# Patient Record
Sex: Male | Born: 1945
Health system: Southern US, Community
[De-identification: ages and names within clinical notes are randomized; demographics above are authoritative.]

## PROBLEM LIST (undated history)

## (undated) DIAGNOSIS — Z8719 Personal history of other diseases of the digestive system: Secondary | ICD-10-CM

## (undated) DIAGNOSIS — Z8551 Personal history of malignant neoplasm of bladder: Secondary | ICD-10-CM

## (undated) DIAGNOSIS — D494 Neoplasm of unspecified behavior of bladder: Secondary | ICD-10-CM

## (undated) DIAGNOSIS — I10 Essential (primary) hypertension: Secondary | ICD-10-CM

## (undated) DIAGNOSIS — Z973 Presence of spectacles and contact lenses: Secondary | ICD-10-CM

## (undated) DIAGNOSIS — K219 Gastro-esophageal reflux disease without esophagitis: Secondary | ICD-10-CM

## (undated) DIAGNOSIS — R351 Nocturia: Secondary | ICD-10-CM

## (undated) DIAGNOSIS — K589 Irritable bowel syndrome without diarrhea: Secondary | ICD-10-CM

## (undated) HISTORY — PX: TONSILLECTOMY: SUR1361

## (undated) HISTORY — PX: COLONOSCOPY: SHX174

---

## 2007-04-18 HISTORY — PX: KNEE ARTHROSCOPY: SUR90

## 2011-04-18 HISTORY — PX: BLEPHAROPLASTY: SUR158

## 2015-11-11 ENCOUNTER — Other Ambulatory Visit: Payer: Self-pay | Admitting: Internal Medicine

## 2015-11-11 ENCOUNTER — Ambulatory Visit
Admission: RE | Admit: 2015-11-11 | Discharge: 2015-11-11 | Disposition: A | Discharge status: Home / Self Care | Payer: Medicare Other | Source: Ambulatory Visit | Attending: Internal Medicine | Admitting: Internal Medicine

## 2015-11-11 DIAGNOSIS — K3589 Other acute appendicitis without perforation or gangrene: Secondary | ICD-10-CM

## 2015-11-11 MED ORDER — IOPAMIDOL (ISOVUE-300) INJECTION 61%
100.0000 mL | Freq: Once | INTRAVENOUS | Status: AC | PRN
  Administered 2015-11-11: 100 mL via INTRAVENOUS

## 2015-11-12 ENCOUNTER — Other Ambulatory Visit: Payer: Self-pay | Admitting: Internal Medicine

## 2016-03-08 ENCOUNTER — Other Ambulatory Visit (HOSPITAL_COMMUNITY): Payer: Self-pay | Admitting: Internal Medicine

## 2016-03-08 DIAGNOSIS — R1011 Right upper quadrant pain: Secondary | ICD-10-CM

## 2016-03-16 ENCOUNTER — Ambulatory Visit (HOSPITAL_COMMUNITY): Payer: Medicare Other

## 2016-03-17 ENCOUNTER — Ambulatory Visit: Payer: Medicare Other

## 2016-03-17 ENCOUNTER — Encounter (HOSPITAL_COMMUNITY)
Admission: RE | Admit: 2016-03-17 | Discharge: 2016-03-17 | Disposition: A | Discharge status: Home / Self Care | Payer: Medicare Other | Source: Ambulatory Visit | Attending: Internal Medicine | Admitting: Internal Medicine

## 2016-03-17 DIAGNOSIS — R1011 Right upper quadrant pain: Secondary | ICD-10-CM | POA: Insufficient documentation

## 2016-03-17 MED ORDER — TECHNETIUM TC 99M MEBROFENIN IV KIT
5.0000 | PACK | Freq: Once | INTRAVENOUS | Status: AC | PRN
  Administered 2016-03-17: 5 via INTRAVENOUS

## 2016-03-23 ENCOUNTER — Encounter: Payer: Self-pay | Admitting: Radiology

## 2017-02-14 IMAGING — CT CT ABD-PELV W/ CM
3 of 5 series · 12 of 36 positions shown, 18 images · IV contrast (READICAT/WATER & [ID] ISOVUE 300)
Comparison: None.

CLINICAL DATA: Right lower quadrant discomfort for 2 weeks

EXAM:
CT ABDOMEN AND PELVIS WITH CONTRAST
TECHNIQUE: Multidetector CT imaging of the abdomen and pelvis was performed
using the standard protocol following bolus administration of
intravenous contrast.
CONTRAST:  100mL [id number] IOPAMIDOL ([id number]) INJECTION 61%

[Series 3: abd/pelvis with · axial · 0.82mm/px · z∈[-366,-21]mm · 7 of 93 slices shown, 12 images]
[im 12/93  soft-tissue]
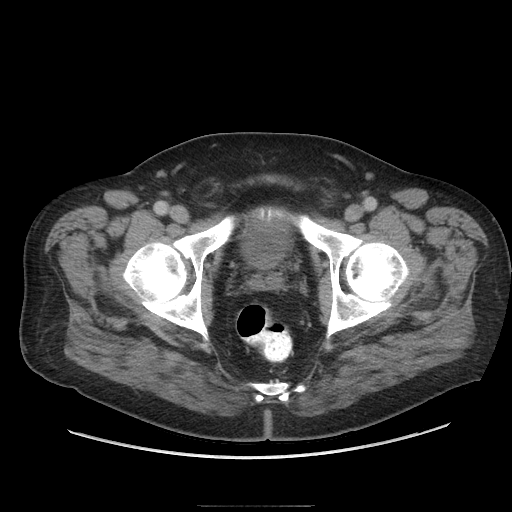
[im 12/93  bone]
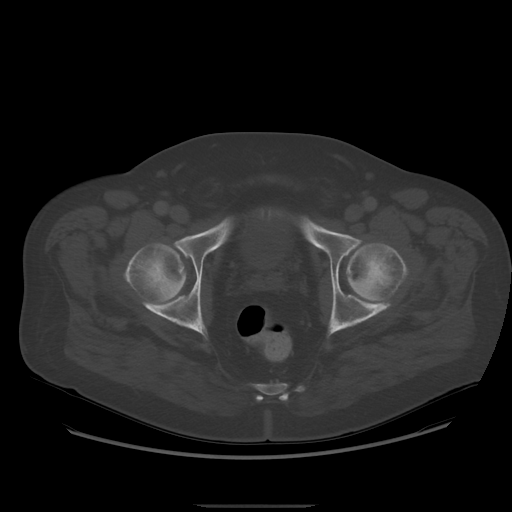
[im 24/93  soft-tissue]
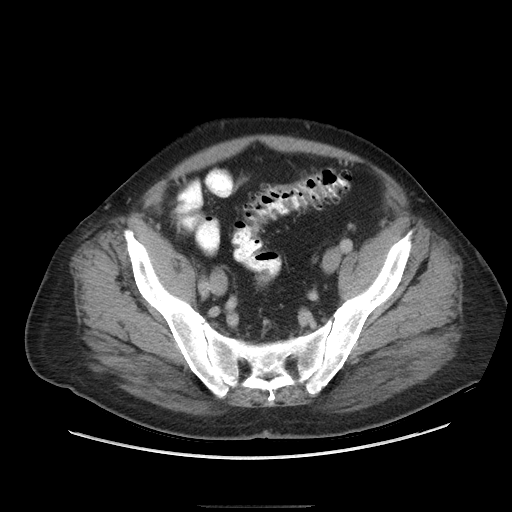
[im 35/93  soft-tissue]
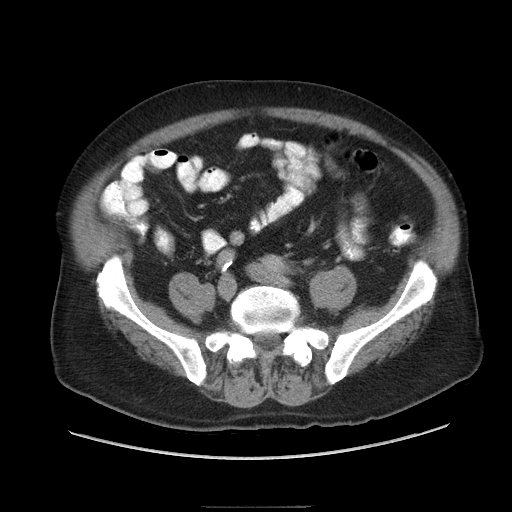
[im 47/93  soft-tissue]
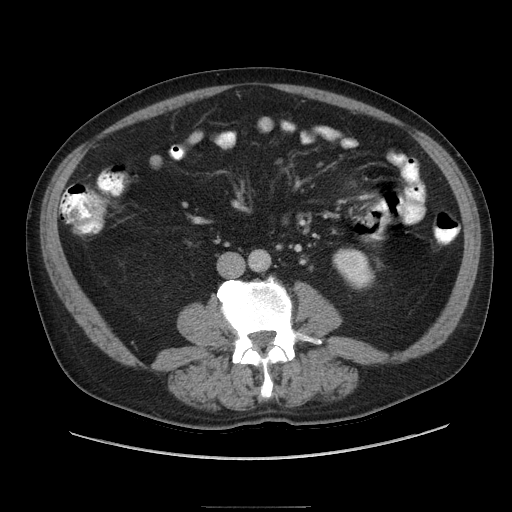
[im 47/93  lung]
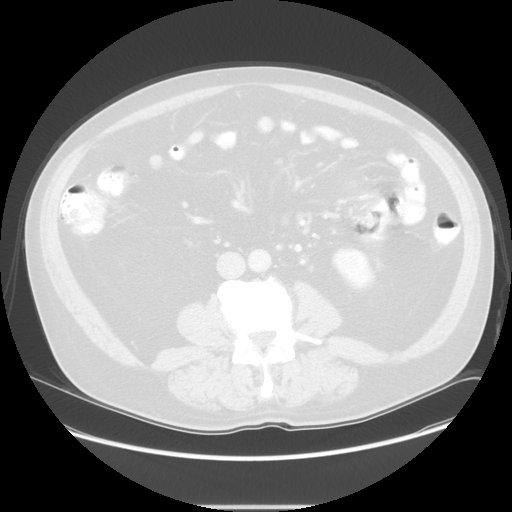
[im 58/93  soft-tissue]
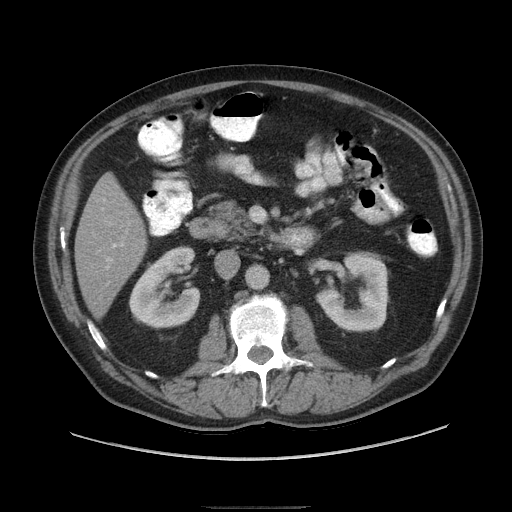
[im 58/93  lung]
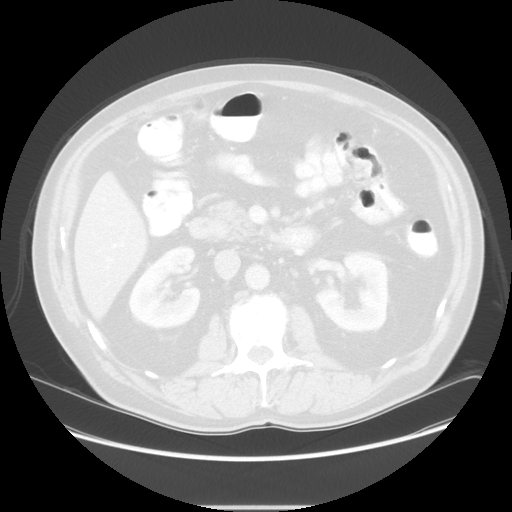
[im 70/93  soft-tissue]
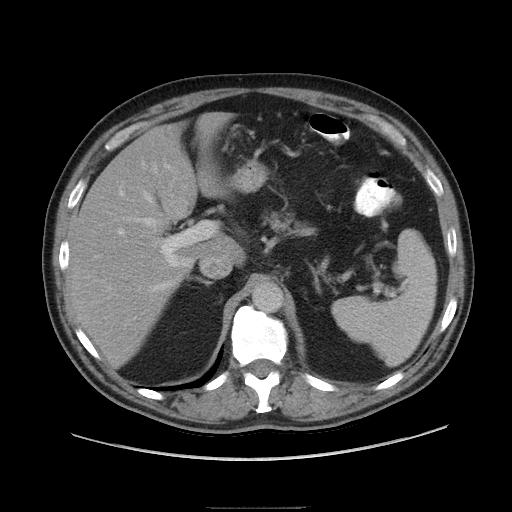
[im 70/93  lung]
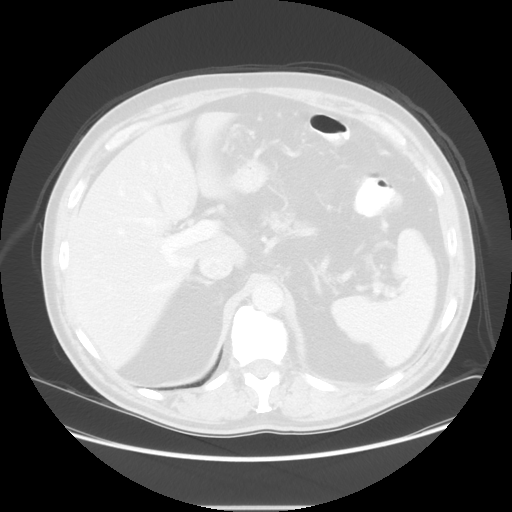
[im 81/93  soft-tissue]
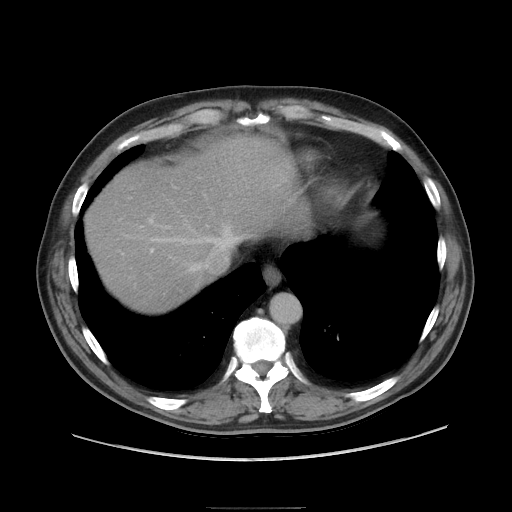
[im 81/93  lung]
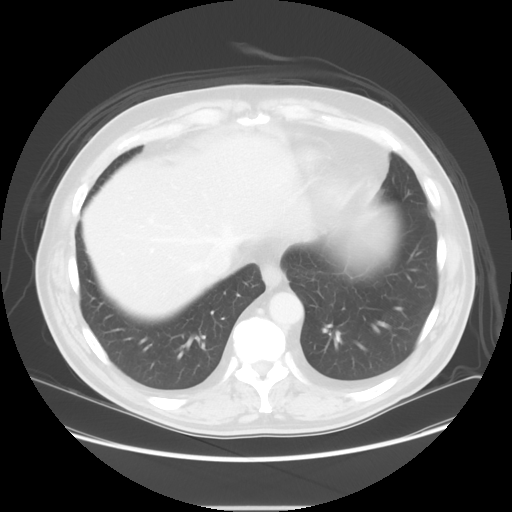

[Series 601: coronal body · coronal · 0.97mm/px · 1 of 127 slices shown, 2 images]
[im 43/127  soft-tissue]
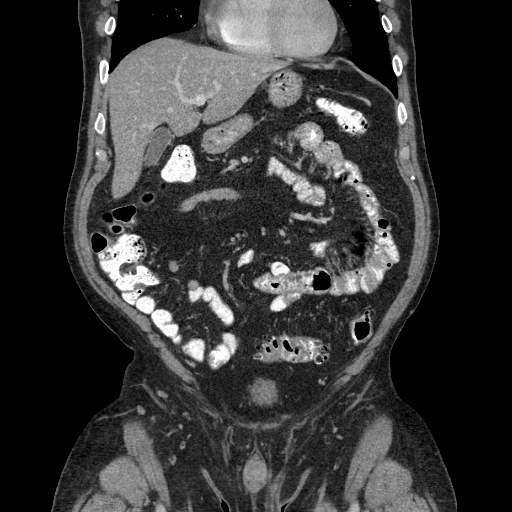
[im 43/127  bone]
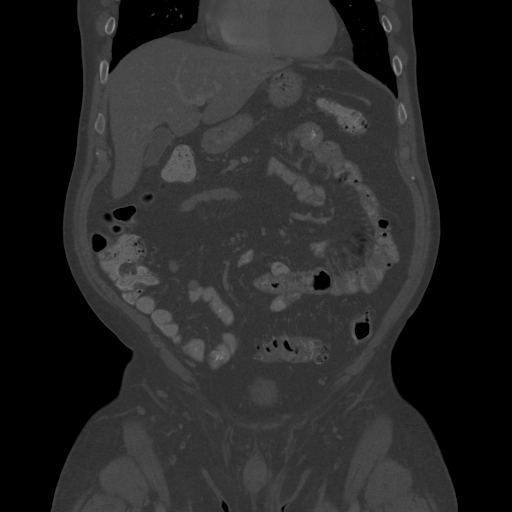

[Series 602: sagittal body · sagittal · 0.97mm/px · 4 of 164 slices shown]
[im 11/164  soft-tissue]
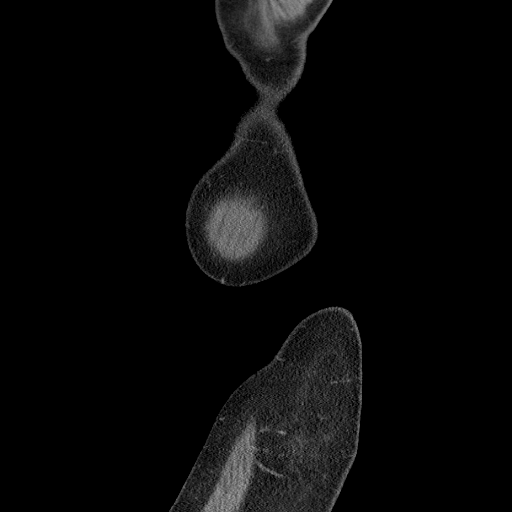
[im 31/164  soft-tissue]
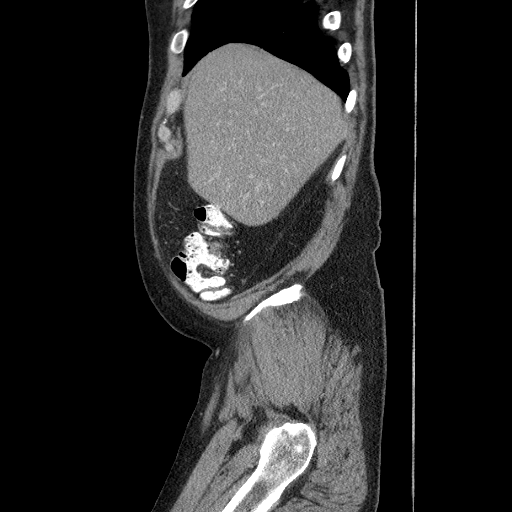
[im 51/164  soft-tissue]
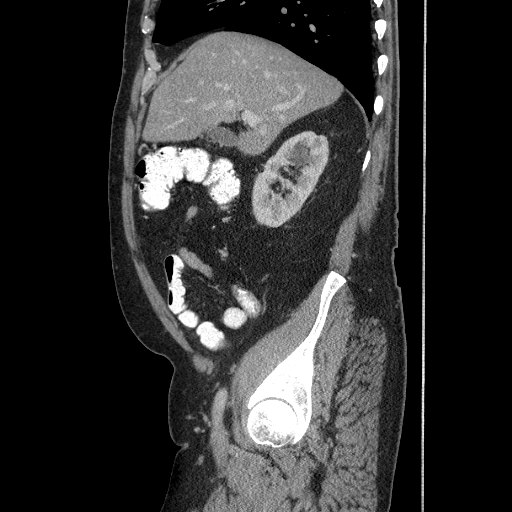
[im 72/164  soft-tissue]
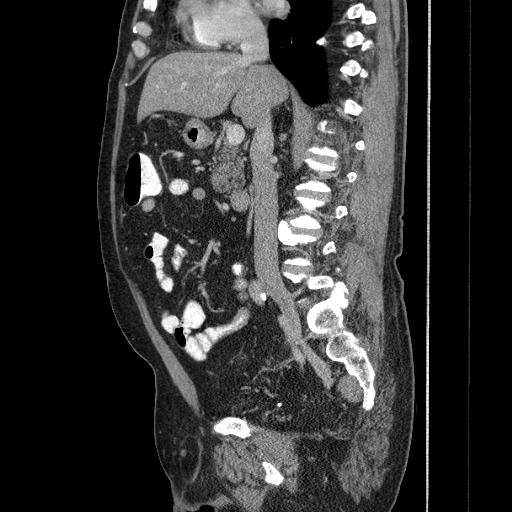

[12 of 36 positions shown; findings below may reference images not displayed]

Creatinine was obtained on site at [HOSPITAL] at [REDACTED].Results: Creatinine 0.9 mg/dL. GFR of 87.
FINDINGS: The lung bases are clear. The liver enhances with no suspicious
abnormality. The liver may be somewhat low in attenuation which
could indicate fatty infiltration. Recommend correlation with LFTs.
A single focus of calcification is noted near the dome of the right
lobe consistent with prior granulomatous disease. A small
low-attenuation structure near the posterior dome most likely
represents small cyst or hemangioma and is of doubtful significance.
No calcified gallstones are noted. The pancreas is normal in size
and the pancreatic duct is not dilated. The adrenal glands and
spleen are unremarkable. The stomach is decompressed. No renal
calculi are seen and there is no evidence of hydronephrosis. An
apparent parapelvic cyst is noted in the upper pole of the right
kidney. The proximal ureters are normal in caliber. The abdominal
aorta is normal in caliber.

The urinary bladder is not well distended and therefore is slightly
thick walled. There is mild focal thickening of the right
posterolateral urinary bladder wall of questionable significance,
but a bladder lesion would be difficult to exclude with this exam.
Clinical correlation is recommended. The prostate is normal in size
for age. Multiple rectosigmoid and distal descending colon
diverticula are noted. There is no evidence of diverticulitis. The
terminal ileum is well opacified with no abnormality noted. The
appendix is well seen and there is no evidence of acute
appendicitis. The lumbar vertebrae are in normal alignment with
normal intervertebral disc spaces.
IMPRESSION: 1. No explanation for the patient's right lower quadrant pain is
seen.
2. The appendix and terminal ileum are unremarkable.
3. There is an area of slight focal thickening of the right
posterolateral urinary bladder wall of questionable significance.
Correlate clinically.
4. Question mild fatty infiltration of the liver.

## 2017-09-27 ENCOUNTER — Other Ambulatory Visit: Payer: Self-pay | Admitting: Urology

## 2017-10-05 ENCOUNTER — Other Ambulatory Visit (HOSPITAL_COMMUNITY)
Admission: RE | Admit: 2017-10-05 | Discharge: 2017-10-05 | Disposition: A | Discharge status: Home / Self Care | Payer: Medicare Other | Source: Ambulatory Visit | Attending: Internal Medicine | Admitting: Internal Medicine

## 2017-10-05 DIAGNOSIS — R109 Unspecified abdominal pain: Secondary | ICD-10-CM | POA: Insufficient documentation

## 2017-10-05 DIAGNOSIS — R197 Diarrhea, unspecified: Secondary | ICD-10-CM | POA: Diagnosis present

## 2017-10-05 LAB — C DIFFICILE QUICK SCREEN W PCR REFLEX
C DIFFICILE (CDIFF) TOXIN: NEGATIVE
C Diff antigen: NEGATIVE
C Diff interpretation: NOT DETECTED

## 2017-10-06 LAB — GASTROINTESTINAL PANEL BY PCR, STOOL (REPLACES STOOL CULTURE)
ASTROVIRUS: NOT DETECTED
Adenovirus F40/41: NOT DETECTED
Campylobacter species: NOT DETECTED
Cryptosporidium: NOT DETECTED
Cyclospora cayetanensis: NOT DETECTED
ENTAMOEBA HISTOLYTICA: NOT DETECTED
ENTEROAGGREGATIVE E COLI (EAEC): NOT DETECTED
ENTEROPATHOGENIC E COLI (EPEC): NOT DETECTED
ENTEROTOXIGENIC E COLI (ETEC): NOT DETECTED
GIARDIA LAMBLIA: NOT DETECTED
NOROVIRUS GI/GII: NOT DETECTED
Plesimonas shigelloides: NOT DETECTED
Rotavirus A: NOT DETECTED
SAPOVIRUS (I, II, IV, AND V): NOT DETECTED
SHIGA LIKE TOXIN PRODUCING E COLI (STEC): NOT DETECTED
Salmonella species: NOT DETECTED
Shigella/Enteroinvasive E coli (EIEC): NOT DETECTED
VIBRIO CHOLERAE: NOT DETECTED
Vibrio species: NOT DETECTED
Yersinia enterocolitica: NOT DETECTED

## 2017-10-26 ENCOUNTER — Encounter (HOSPITAL_BASED_OUTPATIENT_CLINIC_OR_DEPARTMENT_OTHER): Payer: Self-pay | Admitting: *Deleted

## 2017-10-26 ENCOUNTER — Other Ambulatory Visit: Payer: Self-pay

## 2017-10-26 NOTE — Progress Notes (Signed)
Spoke w/ pt via phone for pre-op interview.  Npo after mn.  Arrive at [facility].  Needs istat and ekg.  Pt has medication list at home and pt was not at home (which is out of town) .  Pt to called on Monday 10-29-2017 to give medication list and then be given instructions if any to take am dos.

## 2017-10-29 ENCOUNTER — Encounter (HOSPITAL_BASED_OUTPATIENT_CLINIC_OR_DEPARTMENT_OTHER): Payer: Self-pay

## 2017-10-29 NOTE — Progress Notes (Signed)
Brendan Evans will take his Omeprazole and Fluoxetine the morning of surgery with a sip of water.

## 2017-10-31 ENCOUNTER — Encounter (HOSPITAL_BASED_OUTPATIENT_CLINIC_OR_DEPARTMENT_OTHER): Payer: Self-pay

## 2017-10-31 ENCOUNTER — Ambulatory Visit (HOSPITAL_BASED_OUTPATIENT_CLINIC_OR_DEPARTMENT_OTHER): Payer: Medicare Other | Admitting: Anesthesiology

## 2017-10-31 ENCOUNTER — Encounter (HOSPITAL_BASED_OUTPATIENT_CLINIC_OR_DEPARTMENT_OTHER)
Admission: RE | Disposition: A | Discharge status: Home / Self Care | Payer: Self-pay | Source: Ambulatory Visit | Attending: Urology

## 2017-10-31 ENCOUNTER — Ambulatory Visit (HOSPITAL_BASED_OUTPATIENT_CLINIC_OR_DEPARTMENT_OTHER)
Admission: RE | Admit: 2017-10-31 | Discharge: 2017-10-31 | Disposition: A | Discharge status: Home / Self Care | Payer: Medicare Other | Source: Ambulatory Visit | Attending: Urology | Admitting: Urology

## 2017-10-31 DIAGNOSIS — C674 Malignant neoplasm of posterior wall of bladder: Secondary | ICD-10-CM | POA: Diagnosis not present

## 2017-10-31 DIAGNOSIS — N281 Cyst of kidney, acquired: Secondary | ICD-10-CM | POA: Diagnosis not present

## 2017-10-31 DIAGNOSIS — D494 Neoplasm of unspecified behavior of bladder: Secondary | ICD-10-CM | POA: Diagnosis present

## 2017-10-31 DIAGNOSIS — C671 Malignant neoplasm of dome of bladder: Secondary | ICD-10-CM

## 2017-10-31 DIAGNOSIS — Z79899 Other long term (current) drug therapy: Secondary | ICD-10-CM | POA: Diagnosis not present

## 2017-10-31 DIAGNOSIS — I10 Essential (primary) hypertension: Secondary | ICD-10-CM | POA: Insufficient documentation

## 2017-10-31 DIAGNOSIS — Z87440 Personal history of urinary (tract) infections: Secondary | ICD-10-CM | POA: Insufficient documentation

## 2017-10-31 DIAGNOSIS — Z87891 Personal history of nicotine dependence: Secondary | ICD-10-CM | POA: Insufficient documentation

## 2017-10-31 DIAGNOSIS — Z882 Allergy status to sulfonamides status: Secondary | ICD-10-CM | POA: Insufficient documentation

## 2017-10-31 HISTORY — DX: Essential (primary) hypertension: I10

## 2017-10-31 HISTORY — DX: Irritable bowel syndrome without diarrhea: K58.9

## 2017-10-31 HISTORY — PX: TRANSURETHRAL RESECTION OF BLADDER TUMOR: SHX2575

## 2017-10-31 HISTORY — DX: Neoplasm of unspecified behavior of bladder: D49.4

## 2017-10-31 HISTORY — DX: Irritable bowel syndrome, unspecified: K58.9

## 2017-10-31 HISTORY — DX: Presence of spectacles and contact lenses: Z97.3

## 2017-10-31 HISTORY — PX: CYSTOSCOPY W/ RETROGRADES: SHX1426

## 2017-10-31 HISTORY — DX: Nocturia: R35.1

## 2017-10-31 LAB — POCT I-STAT 4, (NA,K, GLUC, HGB,HCT)
Glucose, Bld: 40 mg/dL — CL (ref 70–99)
HCT: 43 % (ref 39.0–52.0)
Hemoglobin: 14.6 g/dL (ref 13.0–17.0)
POTASSIUM: 4.3 mmol/L (ref 3.5–5.1)
Sodium: 140 mmol/L (ref 135–145)

## 2017-10-31 LAB — GLUCOSE, CAPILLARY: GLUCOSE-CAPILLARY: 116 mg/dL — AB (ref 70–99)

## 2017-10-31 SURGERY — TURBT (TRANSURETHRAL RESECTION OF BLADDER TUMOR)
Anesthesia: General

## 2017-10-31 MED ORDER — ONDANSETRON HCL 4 MG/2ML IJ SOLN
INTRAMUSCULAR | Status: DC | PRN
  Administered 2017-10-31: 4 mg via INTRAVENOUS

## 2017-10-31 MED ORDER — ONDANSETRON HCL 4 MG/2ML IJ SOLN
INTRAMUSCULAR | Status: AC
  Filled 2017-10-31: qty 2

## 2017-10-31 MED ORDER — BELLADONNA ALKALOIDS-OPIUM 16.2-60 MG RE SUPP
RECTAL | Status: DC | PRN
  Administered 2017-10-31: 1 via RECTAL

## 2017-10-31 MED ORDER — LACTATED RINGERS IV SOLN
INTRAVENOUS | Status: DC
  Administered 2017-10-31: 07:00:00 via INTRAVENOUS
  Filled 2017-10-31: qty 1000

## 2017-10-31 MED ORDER — IOHEXOL 300 MG/ML  SOLN
INTRAMUSCULAR | Status: DC | PRN
  Administered 2017-10-31: 13 mL via URETHRAL

## 2017-10-31 MED ORDER — CEFAZOLIN SODIUM-DEXTROSE 2-4 GM/100ML-% IV SOLN
2.0000 g | INTRAVENOUS | Status: AC
  Administered 2017-10-31: 2 g via INTRAVENOUS
  Filled 2017-10-31: qty 100

## 2017-10-31 MED ORDER — TRAMADOL HCL 50 MG PO TABS
50.0000 mg | ORAL_TABLET | Freq: Four times a day (QID) | ORAL | Status: DC | PRN
  Administered 2017-10-31: 50 mg via ORAL
  Filled 2017-10-31: qty 1

## 2017-10-31 MED ORDER — TRAMADOL HCL 50 MG PO TABS: 50.0000 mg | ORAL_TABLET | Freq: Four times a day (QID) | ORAL | 0 refills | Status: DC | PRN

## 2017-10-31 MED ORDER — LIDOCAINE 2% (20 MG/ML) 5 ML SYRINGE
INTRAMUSCULAR | Status: DC | PRN
  Administered 2017-10-31: 60 mg via INTRAVENOUS
  Administered 2017-10-31: 40 mg via INTRAVENOUS

## 2017-10-31 MED ORDER — TRAMADOL HCL 50 MG PO TABS
ORAL_TABLET | ORAL | Status: AC
  Filled 2017-10-31: qty 1

## 2017-10-31 MED ORDER — SODIUM CHLORIDE 0.9 % IR SOLN
Status: DC | PRN
  Administered 2017-10-31: 12000 mL via INTRAVESICAL
  Administered 2017-10-31: 3000 mL via INTRAVESICAL

## 2017-10-31 MED ORDER — FENTANYL CITRATE (PF) 100 MCG/2ML IJ SOLN
INTRAMUSCULAR | Status: AC
Start: 2017-10-31 — End: ?
  Filled 2017-10-31: qty 2

## 2017-10-31 MED ORDER — ARTIFICIAL TEARS OPHTHALMIC OINT
TOPICAL_OINTMENT | OPHTHALMIC | Status: AC
  Filled 2017-10-31: qty 3.5

## 2017-10-31 MED ORDER — DEXAMETHASONE SODIUM PHOSPHATE 4 MG/ML IJ SOLN
INTRAMUSCULAR | Status: DC | PRN
  Administered 2017-10-31: 10 mg via INTRAVENOUS

## 2017-10-31 MED ORDER — PHENAZOPYRIDINE HCL 100 MG PO TABS
ORAL_TABLET | ORAL | Status: AC
  Filled 2017-10-31: qty 2

## 2017-10-31 MED ORDER — LIDOCAINE 2% (20 MG/ML) 5 ML SYRINGE
INTRAMUSCULAR | Status: AC
  Filled 2017-10-31: qty 5

## 2017-10-31 MED ORDER — CEFAZOLIN SODIUM-DEXTROSE 2-4 GM/100ML-% IV SOLN
INTRAVENOUS | Status: AC
  Filled 2017-10-31: qty 100

## 2017-10-31 MED ORDER — PROPOFOL 10 MG/ML IV BOLUS
INTRAVENOUS | Status: AC
  Filled 2017-10-31: qty 40

## 2017-10-31 MED ORDER — PROPOFOL 10 MG/ML IV BOLUS
INTRAVENOUS | Status: DC | PRN
  Administered 2017-10-31 (×2): 100 mg via INTRAVENOUS

## 2017-10-31 MED ORDER — DEXAMETHASONE SODIUM PHOSPHATE 10 MG/ML IJ SOLN
INTRAMUSCULAR | Status: AC
  Filled 2017-10-31: qty 1

## 2017-10-31 MED ORDER — PHENAZOPYRIDINE HCL 200 MG PO TABS
200.0000 mg | ORAL_TABLET | Freq: Three times a day (TID) | ORAL | Status: DC
  Administered 2017-10-31: 200 mg via ORAL
  Filled 2017-10-31: qty 1

## 2017-10-31 MED ORDER — BELLADONNA ALKALOIDS-OPIUM 16.2-60 MG RE SUPP
RECTAL | Status: AC
  Filled 2017-10-31: qty 1

## 2017-10-31 MED ORDER — FENTANYL CITRATE (PF) 100 MCG/2ML IJ SOLN
INTRAMUSCULAR | Status: DC | PRN
  Administered 2017-10-31: 25 ug via INTRAVENOUS
  Administered 2017-10-31 (×2): 50 ug via INTRAVENOUS
  Administered 2017-10-31 (×3): 25 ug via INTRAVENOUS

## 2017-10-31 MED ORDER — PHENAZOPYRIDINE HCL 200 MG PO TABS: 200.0000 mg | ORAL_TABLET | Freq: Three times a day (TID) | ORAL | 0 refills | Status: DC | PRN

## 2017-10-31 MED ORDER — LIDOCAINE HCL URETHRAL/MUCOSAL 2 % EX GEL
CUTANEOUS | Status: DC | PRN
  Administered 2017-10-31: 1 via URETHRAL

## 2017-10-31 MED FILL — traMADol HCL 50 MG TABS: 50 | 2 days supply | Qty: 15 | Fill #0

## 2017-10-31 SURGICAL SUPPLY — 39 items
BAG DRAIN URO-CYSTO SKYTR STRL (DRAIN) ×4 IMPLANT
BAG URINE DRAINAGE (UROLOGICAL SUPPLIES) ×4 IMPLANT
BASKET LASER NITINOL 1.9FR (BASKET) IMPLANT
BASKET STNLS GEMINI 4WIRE 3FR (BASKET) IMPLANT
BASKET ZERO TIP NITINOL 2.4FR (BASKET) IMPLANT
CATH FOLEY 3WAY 30CC 22FR (CATHETERS) ×4 IMPLANT
CATH URET 5FR 28IN OPEN ENDED (CATHETERS) ×4 IMPLANT
CATH URET DUAL LUMEN 6-10FR 50 (CATHETERS) IMPLANT
CLOTH BEACON ORANGE TIMEOUT ST (SAFETY) ×4 IMPLANT
ELECT REM PT RETURN 9FT ADLT (ELECTROSURGICAL)
ELECTRODE REM PT RTRN 9FT ADLT (ELECTROSURGICAL) IMPLANT
EVACUATOR MICROVAS BLADDER (UROLOGICAL SUPPLIES) IMPLANT
EXTRACTOR STONE 1.7FRX115CM (UROLOGICAL SUPPLIES) IMPLANT
FIBER LASER TRAC TIP (UROLOGICAL SUPPLIES) IMPLANT
GLOVE BIO SURGEON STRL SZ7.5 (GLOVE) ×4 IMPLANT
GOWN STRL REUS W/ TWL XL LVL3 (GOWN DISPOSABLE) ×4 IMPLANT
GOWN STRL REUS W/TWL XL LVL3 (GOWN DISPOSABLE) ×8 IMPLANT
GUIDEWIRE 0.038 PTFE COATED (WIRE) IMPLANT
GUIDEWIRE ANG ZIPWIRE 038X150 (WIRE) IMPLANT
GUIDEWIRE STR DUAL SENSOR (WIRE) ×4 IMPLANT
HOLDER FOLEY CATH W/STRAP (MISCELLANEOUS) IMPLANT
INFUSOR MANOMETER BAG 3000ML (MISCELLANEOUS) ×4 IMPLANT
IV NS IRRIG 3000ML ARTHROMATIC (IV SOLUTION) ×16 IMPLANT
KIT BALLIN UROMAX 15FX10 (LABEL) IMPLANT
KIT BALLN UROMAX 15FX4 (MISCELLANEOUS) IMPLANT
KIT BALLN UROMAX 26 75X4 (MISCELLANEOUS)
KIT TURNOVER CYSTO (KITS) ×4 IMPLANT
LOOP CUT BIPOLAR 24F LRG (ELECTROSURGICAL) ×4 IMPLANT
MANIFOLD NEPTUNE II (INSTRUMENTS) ×4 IMPLANT
NS IRRIG 500ML POUR BTL (IV SOLUTION) ×4 IMPLANT
PACK CYSTO (CUSTOM PROCEDURE TRAY) ×4 IMPLANT
SET HIGH PRES BAL DIL (LABEL)
SHEATH ACCESS URETERAL 38CM (SHEATH) IMPLANT
SYR 30ML LL (SYRINGE) IMPLANT
SYRINGE IRR TOOMEY STRL 70CC (SYRINGE) ×4 IMPLANT
TUBE CONNECTING 12'X1/4 (SUCTIONS)
TUBE CONNECTING 12X1/4 (SUCTIONS) IMPLANT
TUBING UROLOGY SET (TUBING) IMPLANT
WATER STERILE IRR 500ML POUR (IV SOLUTION) ×4 IMPLANT

## 2017-10-31 NOTE — Transfer of Care (Signed)
Last Vitals:  Vitals Value Taken Time  BP 158/89 10/31/2017  9:34 AM  Temp    Pulse 84 10/31/2017  9:36 AM  Resp 13 10/31/2017  9:36 AM  SpO2 99 % 10/31/2017  9:36 AM  Vitals shown include unvalidated device data.  Last Pain:  Vitals:   10/31/17 0655  TempSrc:   PainSc: 0-No pain      Patients Stated Pain Goal: 5 (10/31/17 7471)  Immediate Anesthesia Transfer of Care Note  Patient: Brendan Evans  Procedure(s) Performed: Procedure(s) (LRB): TRANSURETHRAL RESECTION OF BLADDER TUMOR (TURBT) (N/A) CYSTOSCOPY WITH BILATERAL RETROGRADE PYELOGRAM (Bilateral)  Patient Location: PACU  Anesthesia Type: General  Level of Consciousness: awake, alert  and oriented  Airway & Oxygen Therapy: Patient Spontanous Breathing and Patient connected to nasal cannula oxygen  Post-op Assessment: Report given to PACU RN and Post -op Vital signs reviewed and stable  Post vital signs: Reviewed and stable  Complications: No apparent anesthesia complications

## 2017-10-31 NOTE — Op Note (Signed)
Preoperative diagnosis:  1. Bladder tumor right posterior lateral wall  Postoperative diagnosis:  1. Same  Procedure: 1. Transurethral resection of bladder tumor 3 cm 2. Bilateral retrograde pyelograms with interpretation  Surgeon: Ardis Hughs, MD  Anesthesia: General  Complications: None  Intraoperative findings:  #1: The right retrograde pyelogram demonstrated normal caliber ureter with no filling defects.  The renal pelvis had sharp calyces, there was some decreased filling in the upper pole of the right kidney, correlating with the patient's peripelvic cyst that was seen on CT scan.  No discrete filling defects. #2: The left retrograde pyelogram demonstrated normal caliber ureters with no filling defects along the ureter.  The renal pelvis had sharp calyces with no significant hydronephrosis or filling defects. #3: The patient had a low-grade appearing tumor that spread approximately 3 cm in size.  Was located on the right posterior lateral wall just posterior to the ureteral orifice.  EBL: Minimal  Specimens: Bladder tumor, right posterior lateral wall  Indication: Brendan Evans is a 72 y.o. patient with gross hematuria with an evaluation that demonstrated papillary lesion in the right posterior lateral wall.  After reviewing the management options for treatment, he elected to proceed with the above surgical procedure(s). We have discussed the potential benefits and risks of the procedure, side effects of the proposed treatment, the likelihood of the patient achieving the goals of the procedure, and any potential problems that might occur during the procedure or recuperation. Informed consent has been obtained.  Description of procedure:  The patient was taken to the operating room and general anesthesia was induced.  The patient was placed in the dorsal lithotomy position, prepped and draped in the usual sterile fashion, and preoperative antibiotics were administered. A  preoperative time-out was performed.   A 21 French 30 degrees cystoscope was gently passed through the patient's urethra and into the bladder under visual guidance.  I exchanged the 30 degree lens for the 70 degree lens and performed cystoscopy demonstrating the above findings.  The patient did have a high median bar and intra-vesicoprostatic extrusion, but no other significant abnormalities other than the tumor noted on the right posterior lateral wall.  I then re-exchanged the 30 degree lens and performed the bilateral retrograde pyelograms using Omnipaque contrast as described above.  I then removed the 30 degree 21 French cystoscope and replaced it with the 26 French resectoscope sheath using the visual obturator and the 30 degree lens.  Once in the bladder I exchange the obturator for the loop resection element.  I then proceeded with a transurethral resection of the bladder tumor.  Muscle was noted to be within the specimen.  The resection defect did not appear to extend through the bladder wall.  The base of the tumor as well as the edges were cauterized and hemostasis was noted to be excellent.  The bladder tumor specimen was then irrigated from the patient's bladder.  There is no bleeding at the end of the case.  The patient had a B&O suppository placed in his rectum and urethral lidocaine jelly at the end of the case.  He was subsequently extubated return the PACU in stable condition.  Ardis Hughs, M.D.

## 2017-10-31 NOTE — H&P (Signed)
gross hematuria evaluation  HPI: Brendan Evans is a 72 year-old male patient who was referred by Dr. Lance Muss, MD who is here for further evaluation of gross hematuria.Marland Kitchen  He first noticed the blood approximately 06/15/2017. He has noticed blood only once.   The patient has not had any recent abdominal imaging. He has been told that he has blood in his urine prior to this episode. The patient has had a previous hematuria evaluation.   The patient has had progression of his voiding symptoms over the last 6 months including frequency. The patient complains of recent onset back pain.   The patient does have a history of recurrent UTIs. They do not have a history of kidney stones. He has not been exposed to occupational hazards that may increase their risks for developing cancer. The patient has no family history of GU malignancy. The patient is a former smoker. He quit smoking 40 years ago.   08/15/17: BUN/Cr - 16/0.87, PSA 0.5  CT scan 10/2015 - thickening on right bladder wall     ALLERGIES:     MEDICATIONS: Fish Oil CAPS Oral  FLUoxetine HCl - 40 MG Oral Capsule Oral  Librax 5-2.5 MG Oral Capsule Oral  Multi-Vitamin TABS Oral  Nasacort AQ AERS Nasal  Saw Palmetto TABS Oral     GU PSH: Vasectomy - 2011      PSH Notes: Surgery Of Male Genitalia Vasectomy, Knee Surgery   NON-GU PSH: No Non-GU PSH    GU PMH: BPH w/LUTS, Benign localized prostatic hyperplasia with lower urinary tract symptoms (LUTS) - 2014 Hematuria, Unspec, Hematuria - 2014 Renal cyst, Renal cyst, acquired - 2014      PMH Notes:  1898-04-17 00:00:00 - Note: Normal Routine History And Physical Adult   NON-GU PMH: No Non-GU PMH    FAMILY HISTORY: Family Health Status Number - Runs In Family Father Deceased At Age44 ___ - Runs In Family Mother Deceased At Age 110 from diabetic complicati - Runs In Family   SOCIAL HISTORY: No Social History     Notes: Marital History - Single, Caffeine Use, Tobacco Use,  Alcohol Use   REVIEW OF SYSTEMS:    GU Review Male:   Patient denies frequent urination, hard to postpone urination, burning/ pain with urination, get up at night to urinate, leakage of urine, stream starts and stops, trouble starting your stream, have to strain to urinate , erection problems, and penile pain.  Gastrointestinal (Upper):   Patient denies nausea, vomiting, and indigestion/ heartburn.  Gastrointestinal (Lower):   Patient denies diarrhea and constipation.  Constitutional:   Patient denies fever, night sweats, weight loss, and fatigue.  Skin:   Patient denies skin rash/ lesion and itching.  Eyes:   Patient denies blurred vision and double vision.  Ears/ Nose/ Throat:   Patient denies sore throat and sinus problems.  Hematologic/Lymphatic:   Patient denies swollen glands and easy bruising.  Cardiovascular:   Patient denies leg swelling and chest pains.  Respiratory:   Patient denies cough and shortness of breath.  Endocrine:   Patient denies excessive thirst.  Musculoskeletal:   Patient denies back pain and joint pain.  Neurological:   Patient denies headaches and dizziness.  Psychologic:   Patient denies depression and anxiety.   VITAL SIGNS:      09/25/2017 10:00 AM  Weight 200 lb / 90.72 kg  BP 148/88 mmHg  Pulse 86 /min  Temperature 99.0 F / 37.2 C   GU PHYSICAL EXAMINATION:  Scrotum: No lesions. No edema. No cysts. No warts.  Testes: No tenderness, no swelling, no enlargement left testes. No tenderness, no swelling, no enlargement right testes. Normal location left testes. Normal location right testes. No mass, no cyst, no varicocele, no hydrocele left testes. No mass, no cyst, no varicocele, no hydrocele right testes.  Urethral Meatus: Normal size. No lesion, no wart, no discharge, no polyp. Normal location.  Penis: Circumcised, no warts, no cracks. No dorsal Peyronie's plaques, no left corporal Peyronie's plaques, no right corporal Peyronie's plaques, no scarring, no  warts. No balanitis, no meatal stenosis.   MULTI-SYSTEM PHYSICAL EXAMINATION:    Constitutional: Well-nourished. No physical deformities. Normally developed. Good grooming.  Neck: Neck symmetrical, not swollen. Normal tracheal position.  Respiratory: No labored breathing, no use of accessory muscles. Normal breath sounds.  Cardiovascular: Regular rate and rhythm. No murmur, no gallop. Normal temperature, normal extremity pulses, no swelling, no varicosities.  Lymphatic: No enlargement of neck, axillae, groin.  Skin: No paleness, no jaundice, no cyanosis. No lesion, no ulcer, no rash.  Neurologic / Psychiatric: Oriented to time, oriented to place, oriented to person. No depression, no anxiety, no agitation.  Gastrointestinal: No mass, no tenderness, no rigidity, non obese abdomen.  Eyes: Normal conjunctivae. Normal eyelids.  Ears, Nose, Mouth, and Throat: Left ear no scars, no lesions, no masses. Right ear no scars, no lesions, no masses. Nose no scars, no lesions, no masses. Normal hearing. Normal lips.  Musculoskeletal: Normal gait and station of head and neck.     PAST DATA REVIEWED:  Source Of History:  Patient  Records Review:   Previous Doctor Records, Previous Patient Records, POC Tool  X-Ray Review: C.T. Abdomen/Pelvis: Reviewed Films. Discussed With Patient. 2017 shows a thickened area on the right bladder wall.    PROCEDURES:         Flexible Cystoscopy - 52000  Risks, benefits, and some of the potential complications of the procedure were discussed at length with the patient including infection, bleeding, voiding discomfort, urinary retention, fever, chills, sepsis, and others. All questions were answered. Informed consent was obtained. Sterile technique and intraurethral analgesia were used.  Meatus:  Normal size. Normal location. Normal condition.  Urethra:  No strictures.  External Sphincter:  Normal.  Verumontanum:  Normal.  Prostate:  Non-obstructing. No hyperplasia.   Bladder Neck:  Non-obstructing.  Ureteral Orifices:  Normal location. Normal size. Normal shape. Effluxed clear urine.  Bladder:  2-3 cm papillary tumor on the right lateral posterior wall. There is some small satellite lesions around it.      The lower urinary tract was carefully examined. The procedure was well-tolerated and without complications. Antibiotic instructions were given. Instructions were given to call the office immediately for bloody urine, difficulty urinating, urinary retention, painful or frequent urination, fever, chills, nausea, vomiting or other illness. The patient stated that he understood these instructions and would comply with them.         Urinalysis Dipstick Dipstick Cont'd  Color: Yellow Bilirubin: Neg  Appearance: Clear Ketones: Neg  Specific Gravity: 1.025 Blood: Neg  pH: 5.5 Protein: Trace  Glucose: Neg Urobilinogen: 0.2    Nitrites: Neg    Leukocyte Esterase: Neg    ASSESSMENT:      ICD-10 Details  1 GU:   Gross hematuria - R31.0    PLAN:           Schedule Return Visit/Planned Activity: ASAP - Schedule Surgery          Document  Letter(s):  Created for Patient: Clinical Summary         Notes:   The patient has a small papillary tumor in his bladder consistent with bladder cancer. I went over the diagnosis with the patient and the treatment of it. We discussed a transurethral resection of his bladder tumor. I went over the procedure as well as the risk and benefits. After going to the procedure the patient had a firm understanding of it and also questions were answered. Our plan is to schedule the patient for removal of the tumor as soon as possible.

## 2017-10-31 NOTE — Anesthesia Preprocedure Evaluation (Signed)
Anesthesia Evaluation  Patient identified by MRN, date of birth, ID band Patient awake    Reviewed: Allergy & Precautions, NPO status , Patient's Chart, lab work & pertinent test results  Airway Mallampati: I  TM Distance: >3 FB Neck ROM: Full    Dental   Pulmonary former smoker,    Pulmonary exam normal        Cardiovascular hypertension, Pt. on medications Normal cardiovascular exam     Neuro/Psych    GI/Hepatic   Endo/Other    Renal/GU      Musculoskeletal   Abdominal   Peds  Hematology   Anesthesia Other Findings   Reproductive/Obstetrics                             Anesthesia Physical Anesthesia Plan  ASA: II  Anesthesia Plan: General   Post-op Pain Management:    Induction: Intravenous  PONV Risk Score and Plan: 2 and Ondansetron and Treatment may vary due to age or medical condition  Airway Management Planned: LMA  Additional Equipment:   Intra-op Plan:   Post-operative Plan: Extubation in OR  Informed Consent: I have reviewed the patients History and Physical, chart, labs and discussed the procedure including the risks, benefits and alternatives for the proposed anesthesia with the patient or authorized representative who has indicated his/her understanding and acceptance.     Plan Discussed with: CRNA and Surgeon  Anesthesia Plan Comments:         Anesthesia Quick Evaluation

## 2017-10-31 NOTE — Anesthesia Procedure Notes (Signed)
Procedure Name: LMA Insertion Date/Time: 10/31/2017 8:42 AM Performed by: Lillia Abed, MD Pre-anesthesia Checklist: Patient identified, Emergency Drugs available, Suction available and Patient being monitored Patient Re-evaluated:Patient Re-evaluated prior to induction Oxygen Delivery Method: Circle system utilized Preoxygenation: Pre-oxygenation with 100% oxygen Induction Type: IV induction Ventilation: Mask ventilation without difficulty LMA: LMA inserted LMA Size: 4.0 Number of attempts: 1 Airway Equipment and Method: Bite block Placement Confirmation: positive ETCO2 Tube secured with: Tape Dental Injury: Teeth and Oropharynx as per pre-operative assessment

## 2017-10-31 NOTE — Anesthesia Postprocedure Evaluation (Signed)
Anesthesia Post Note  Patient: Brendan Evans  Procedure(s) Performed: TRANSURETHRAL RESECTION OF BLADDER TUMOR (TURBT) (N/A ) CYSTOSCOPY WITH BILATERAL RETROGRADE PYELOGRAM (Bilateral )     Patient location during evaluation: PACU Anesthesia Type: General Level of consciousness: awake and alert Pain management: pain level controlled Vital Signs Assessment: post-procedure vital signs reviewed and stable Respiratory status: spontaneous breathing, nonlabored ventilation, respiratory function stable and patient connected to nasal cannula oxygen Cardiovascular status: blood pressure returned to baseline and stable Postop Assessment: no apparent nausea or vomiting Anesthetic complications: no    Last Vitals:  Vitals:   10/31/17 1000 10/31/17 1204  BP: (!) 162/94 (!) 143/90  Pulse: 77 84  Resp: 16 17  Temp:  37.4 C  SpO2: 97% 95%    Last Pain:  Vitals:   10/31/17 1200  TempSrc:   PainSc: 3                  Shaquira Moroz DAVID

## 2017-10-31 NOTE — Discharge Instructions (Signed)
Transurethral Resection of Bladder Tumor (TURBT) or Bladder Biopsy ° ° °Definition: ° Transurethral Resection of the Bladder Tumor is a surgical procedure used to diagnose and remove tumors within the bladder. TURBT is the most common treatment for early stage bladder cancer. ° °General instructions: °   ° Your recent bladder surgery requires very little post hospital care but some definite precautions. ° °Despite the fact that no skin incisions were used, the area around the bladder incisions are raw and covered with scabs to promote healing and prevent bleeding. Certain precautions are needed to insure that the scabs are not disturbed over the next 2-4 weeks while the healing proceeds. ° °Because the raw surface inside your bladder and the irritating effects of urine you may expect frequency of urination and/or urgency (a stronger desire to urinate) and perhaps even getting up at night more often. This will usually resolve or improve slowly over the healing period. You may see some blood in your urine over the first 6 weeks. Do not be alarmed, even if the urine was clear for a while. Get off your feet and drink lots of fluids until clearing occurs. If you start to pass clots or don't improve call us. ° °Diet: ° °You may return to your normal diet immediately. Because of the raw surface of your bladder, alcohol, spicy foods, foods high in acid and drinks with caffeine may cause irritation or frequency and should be used in moderation. To keep your urine flowing freely and avoid constipation, drink plenty of fluids during the day (8-10 glasses). Tip: Avoid cranberry juice because it is very acidic. ° °Activity: ° °Your physical activity doesn't need to be restricted. However, if you are very active, you may see some blood in the urine. We suggest that you reduce your activity under the circumstances until the bleeding has stopped. ° °Bowels: ° °It is important to keep your bowels regular during the postoperative  period. Straining with bowel movements can cause bleeding. A bowel movement every other day is reasonable. Use a mild laxative if needed, such as milk of magnesia 2-3 tablespoons, or 2 Dulcolax tablets. Call if you continue to have problems. If you had been taking narcotics for pain, before, during or after your surgery, you may be constipated. Take a laxative if necessary. ° ° ° °Medication: ° °You should resume your pre-surgery medications unless told not to. In addition you may be given an antibiotic to prevent or treat infection. Antibiotics are not always necessary. All medication should be taken as prescribed until the bottles are finished unless you are having an unusual reaction to one of the drugs. ° ° ° ° ° °Post Anesthesia Home Care Instructions ° °Activity: °Get plenty of rest for the remainder of the day. A responsible individual must stay with you for 24 hours following the procedure.  °For the next 24 hours, DO NOT: °-Drive a car °-Operate machinery °-Drink alcoholic beverages °-Take any medication unless instructed by your physician °-Make any legal decisions or sign important papers. ° °Meals: °Start with liquid foods such as gelatin or soup. Progress to regular foods as tolerated. Avoid greasy, spicy, heavy foods. If nausea and/or vomiting occur, drink only clear liquids until the nausea and/or vomiting subsides. Call your physician if vomiting continues. ° °Special Instructions/Symptoms: °Your throat may feel dry or sore from the anesthesia or the breathing tube placed in your throat during surgery. If this causes discomfort, gargle with warm salt water. The discomfort should disappear within 24   hours. ° °If you had a scopolamine patch placed behind your ear for the management of post- operative nausea and/or vomiting: ° °1. The medication in the patch is effective for 72 hours, after which it should be removed.  Wrap patch in a tissue and discard in the trash. Wash hands thoroughly with soap and  water. °2. You may remove the patch earlier than 72 hours if you experience unpleasant side effects which may include dry mouth, dizziness or visual disturbances. °3. Avoid touching the patch. Wash your hands with soap and water after contact with the patch. °  ° °

## 2017-11-01 ENCOUNTER — Encounter (HOSPITAL_BASED_OUTPATIENT_CLINIC_OR_DEPARTMENT_OTHER): Payer: Self-pay | Admitting: Urology

## 2019-02-11 ENCOUNTER — Other Ambulatory Visit: Payer: Self-pay | Admitting: Urology

## 2019-02-17 ENCOUNTER — Other Ambulatory Visit (HOSPITAL_COMMUNITY): Payer: Medicare Other

## 2019-02-17 ENCOUNTER — Inpatient Hospital Stay (HOSPITAL_COMMUNITY): Admission: RE | Admit: 2019-02-17 | Payer: Medicare Other | Source: Ambulatory Visit

## 2019-02-18 ENCOUNTER — Other Ambulatory Visit (HOSPITAL_COMMUNITY)
Admission: RE | Admit: 2019-02-18 | Discharge: 2019-02-18 | Disposition: A | Discharge status: Home / Self Care | Payer: Medicare Other | Source: Ambulatory Visit | Attending: Urology | Admitting: Urology

## 2019-02-18 DIAGNOSIS — Z20828 Contact with and (suspected) exposure to other viral communicable diseases: Secondary | ICD-10-CM | POA: Insufficient documentation

## 2019-02-18 DIAGNOSIS — Z01812 Encounter for preprocedural laboratory examination: Secondary | ICD-10-CM | POA: Diagnosis present

## 2019-02-18 LAB — SARS CORONAVIRUS 2 (TAT 6-24 HRS): SARS Coronavirus 2: NEGATIVE

## 2019-02-18 NOTE — Progress Notes (Signed)
Left message for patient to contact back to reschedule COVID test for procedure on 11/5

## 2019-02-19 ENCOUNTER — Other Ambulatory Visit: Payer: Self-pay

## 2019-02-19 ENCOUNTER — Encounter (HOSPITAL_BASED_OUTPATIENT_CLINIC_OR_DEPARTMENT_OTHER): Payer: Self-pay | Admitting: *Deleted

## 2019-02-19 NOTE — Progress Notes (Signed)
Spoke w/ via phone for pre-op interview--- PT Lab needs dos----  Istat 8 and EKG COVID test ------ 02-18-2019 Arrive at ------- 0900 NPO after ------ [place] Medications to take morning of surgery ----- Bentyl, Colazal, Prozac, Prilosec, Verapamil w/ sips of water  Diabetic medication ----- n/a Patient Special Instructions ----- n/a Pre-Op special Istructions ----- n/a Patient verbalized understanding  of instructions that were given at this phone interview. Patient denies shortness of breath, chest pain, fever, cough a this phone interview.

## 2019-02-20 ENCOUNTER — Other Ambulatory Visit: Payer: Self-pay

## 2019-02-20 ENCOUNTER — Ambulatory Visit (HOSPITAL_BASED_OUTPATIENT_CLINIC_OR_DEPARTMENT_OTHER)
Admission: RE | Admit: 2019-02-20 | Discharge: 2019-02-20 | Disposition: A | Discharge status: Home / Self Care | Payer: Medicare Other | Attending: Urology | Admitting: Urology

## 2019-02-20 ENCOUNTER — Encounter (HOSPITAL_BASED_OUTPATIENT_CLINIC_OR_DEPARTMENT_OTHER)
Admission: RE | Disposition: A | Discharge status: Home / Self Care | Payer: Self-pay | Source: Home / Self Care | Attending: Urology

## 2019-02-20 ENCOUNTER — Ambulatory Visit (HOSPITAL_BASED_OUTPATIENT_CLINIC_OR_DEPARTMENT_OTHER): Payer: Medicare Other | Admitting: Anesthesiology

## 2019-02-20 ENCOUNTER — Encounter (HOSPITAL_BASED_OUTPATIENT_CLINIC_OR_DEPARTMENT_OTHER): Payer: Self-pay

## 2019-02-20 DIAGNOSIS — I10 Essential (primary) hypertension: Secondary | ICD-10-CM | POA: Insufficient documentation

## 2019-02-20 DIAGNOSIS — K219 Gastro-esophageal reflux disease without esophagitis: Secondary | ICD-10-CM | POA: Insufficient documentation

## 2019-02-20 DIAGNOSIS — Z87891 Personal history of nicotine dependence: Secondary | ICD-10-CM | POA: Diagnosis not present

## 2019-02-20 DIAGNOSIS — Z882 Allergy status to sulfonamides status: Secondary | ICD-10-CM | POA: Insufficient documentation

## 2019-02-20 DIAGNOSIS — C674 Malignant neoplasm of posterior wall of bladder: Secondary | ICD-10-CM | POA: Insufficient documentation

## 2019-02-20 DIAGNOSIS — Z79899 Other long term (current) drug therapy: Secondary | ICD-10-CM | POA: Diagnosis not present

## 2019-02-20 DIAGNOSIS — Z8551 Personal history of malignant neoplasm of bladder: Secondary | ICD-10-CM | POA: Insufficient documentation

## 2019-02-20 DIAGNOSIS — C679 Malignant neoplasm of bladder, unspecified: Secondary | ICD-10-CM | POA: Diagnosis present

## 2019-02-20 HISTORY — DX: Gastro-esophageal reflux disease without esophagitis: K21.9

## 2019-02-20 HISTORY — DX: Personal history of malignant neoplasm of bladder: Z85.51

## 2019-02-20 HISTORY — DX: Personal history of other diseases of the digestive system: Z87.19

## 2019-02-20 HISTORY — PX: TRANSURETHRAL RESECTION OF BLADDER TUMOR WITH MITOMYCIN-C: SHX6459

## 2019-02-20 LAB — POCT I-STAT, CHEM 8
BUN: 18 mg/dL (ref 8–23)
Calcium, Ion: 1.25 mmol/L (ref 1.15–1.40)
Chloride: 101 mmol/L (ref 98–111)
Creatinine, Ser: 0.9 mg/dL (ref 0.61–1.24)
Glucose, Bld: 104 mg/dL — ABNORMAL HIGH (ref 70–99)
HCT: 44 % (ref 39.0–52.0)
Hemoglobin: 15 g/dL (ref 13.0–17.0)
Potassium: 4.2 mmol/L (ref 3.5–5.1)
Sodium: 141 mmol/L (ref 135–145)
TCO2: 26 mmol/L (ref 22–32)

## 2019-02-20 SURGERY — TRANSURETHRAL RESECTION OF BLADDER TUMOR WITH MITOMYCIN-C
Anesthesia: General | Site: Bladder

## 2019-02-20 MED ORDER — LIDOCAINE 2% (20 MG/ML) 5 ML SYRINGE
INTRAMUSCULAR | Status: DC | PRN
  Administered 2019-02-20: 100 mg via INTRAVENOUS

## 2019-02-20 MED ORDER — CEFAZOLIN SODIUM-DEXTROSE 2-4 GM/100ML-% IV SOLN
INTRAVENOUS | Status: AC
  Filled 2019-02-20: qty 100

## 2019-02-20 MED ORDER — BELLADONNA ALKALOIDS-OPIUM 16.2-60 MG RE SUPP
RECTAL | Status: AC
  Filled 2019-02-20: qty 1

## 2019-02-20 MED ORDER — LIDOCAINE 2% (20 MG/ML) 5 ML SYRINGE
INTRAMUSCULAR | Status: AC
  Filled 2019-02-20: qty 5

## 2019-02-20 MED ORDER — FENTANYL CITRATE (PF) 100 MCG/2ML IJ SOLN
INTRAMUSCULAR | Status: DC | PRN
  Administered 2019-02-20 (×2): 50 ug via INTRAVENOUS

## 2019-02-20 MED ORDER — PROPOFOL 10 MG/ML IV BOLUS
INTRAVENOUS | Status: AC
  Filled 2019-02-20: qty 20

## 2019-02-20 MED ORDER — OXYCODONE HCL 5 MG PO TABS
5.0000 mg | ORAL_TABLET | Freq: Once | ORAL | Status: DC | PRN
  Filled 2019-02-20: qty 1

## 2019-02-20 MED ORDER — ONDANSETRON HCL 4 MG/2ML IJ SOLN
INTRAMUSCULAR | Status: AC
  Filled 2019-02-20: qty 2

## 2019-02-20 MED ORDER — FENTANYL CITRATE (PF) 100 MCG/2ML IJ SOLN
INTRAMUSCULAR | Status: AC
  Filled 2019-02-20: qty 2

## 2019-02-20 MED ORDER — ONDANSETRON HCL 4 MG/2ML IJ SOLN
INTRAMUSCULAR | Status: DC | PRN
  Administered 2019-02-20: 4 mg via INTRAVENOUS

## 2019-02-20 MED ORDER — ACETAMINOPHEN 10 MG/ML IV SOLN
1000.0000 mg | Freq: Once | INTRAVENOUS | Status: DC | PRN
  Filled 2019-02-20: qty 100

## 2019-02-20 MED ORDER — DEXAMETHASONE SODIUM PHOSPHATE 10 MG/ML IJ SOLN
INTRAMUSCULAR | Status: AC
  Filled 2019-02-20: qty 1

## 2019-02-20 MED ORDER — DEXAMETHASONE SODIUM PHOSPHATE 4 MG/ML IJ SOLN
INTRAMUSCULAR | Status: DC | PRN
  Administered 2019-02-20: 10 mg via INTRAVENOUS

## 2019-02-20 MED ORDER — PHENAZOPYRIDINE HCL 200 MG PO TABS: 200.0000 mg | ORAL_TABLET | Freq: Three times a day (TID) | ORAL | 0 refills | Status: AC | PRN

## 2019-02-20 MED ORDER — BELLADONNA ALKALOIDS-OPIUM 16.2-60 MG RE SUPP
RECTAL | Status: DC | PRN
  Administered 2019-02-20: 1 via RECTAL

## 2019-02-20 MED ORDER — LIDOCAINE HCL URETHRAL/MUCOSAL 2 % EX GEL
CUTANEOUS | Status: DC | PRN
  Administered 2019-02-20: 1

## 2019-02-20 MED ORDER — PROPOFOL 10 MG/ML IV BOLUS
INTRAVENOUS | Status: DC | PRN
  Administered 2019-02-20: 150 mg via INTRAVENOUS
  Administered 2019-02-20: 100 mg via INTRAVENOUS
  Administered 2019-02-20: 50 mg via INTRAVENOUS

## 2019-02-20 MED ORDER — LACTATED RINGERS IV SOLN
INTRAVENOUS | Status: DC
  Administered 2019-02-20: 10:00:00 1000 mL via INTRAVENOUS
  Administered 2019-02-20: 14:00:00 via INTRAVENOUS
  Filled 2019-02-20: qty 1000

## 2019-02-20 MED ORDER — IOHEXOL 300 MG/ML  SOLN
INTRAMUSCULAR | Status: DC | PRN
  Administered 2019-02-20: 14 mL via URETHRAL

## 2019-02-20 MED ORDER — GEMCITABINE CHEMO FOR BLADDER INSTILLATION 2000 MG
2000.0000 mg | Freq: Once | INTRAVENOUS | Status: AC
  Administered 2019-02-20: 13:00:00 2000 mg via INTRAVESICAL
  Filled 2019-02-20: qty 2000

## 2019-02-20 MED ORDER — FENTANYL CITRATE (PF) 100 MCG/2ML IJ SOLN
25.0000 ug | INTRAMUSCULAR | Status: DC | PRN
  Administered 2019-02-20 (×3): 50 ug via INTRAVENOUS
  Filled 2019-02-20: qty 1

## 2019-02-20 MED ORDER — PHENYLEPHRINE 40 MCG/ML (10ML) SYRINGE FOR IV PUSH (FOR BLOOD PRESSURE SUPPORT)
PREFILLED_SYRINGE | INTRAVENOUS | Status: DC | PRN
  Administered 2019-02-20 (×2): 80 ug via INTRAVENOUS

## 2019-02-20 MED ORDER — PHENYLEPHRINE 40 MCG/ML (10ML) SYRINGE FOR IV PUSH (FOR BLOOD PRESSURE SUPPORT)
PREFILLED_SYRINGE | INTRAVENOUS | Status: AC
  Filled 2019-02-20: qty 10

## 2019-02-20 MED ORDER — CEFAZOLIN SODIUM-DEXTROSE 2-4 GM/100ML-% IV SOLN
2.0000 g | INTRAVENOUS | Status: AC
  Administered 2019-02-20: 12:00:00 2 g via INTRAVENOUS
  Filled 2019-02-20: qty 100

## 2019-02-20 MED ORDER — PROMETHAZINE HCL 25 MG/ML IJ SOLN
6.2500 mg | INTRAMUSCULAR | Status: DC | PRN
  Filled 2019-02-20: qty 1

## 2019-02-20 MED ORDER — TRAMADOL HCL 50 MG PO TABS: 50.0000 mg | ORAL_TABLET | Freq: Four times a day (QID) | ORAL | 0 refills | Status: AC | PRN

## 2019-02-20 MED ORDER — OXYCODONE HCL 5 MG/5ML PO SOLN
5.0000 mg | Freq: Once | ORAL | Status: DC | PRN
  Filled 2019-02-20: qty 5

## 2019-02-20 MED FILL — PHENAZOPYRIDINE 200 MG TAB: 200 | 3 days supply | Qty: 10 | Fill #0

## 2019-02-20 MED FILL — traMADol HCL 50 MG TABS: 50 | 3 days supply | Qty: 15 | Fill #0

## 2019-02-20 SURGICAL SUPPLY — 23 items
BAG DRAIN URO-CYSTO SKYTR STRL (DRAIN) ×3 IMPLANT
BAG URINE DRAIN 2000ML AR STRL (UROLOGICAL SUPPLIES) ×3 IMPLANT
CATH FOLEY 2WAY SLVR  5CC 18FR (CATHETERS) ×2
CATH FOLEY 2WAY SLVR 5CC 18FR (CATHETERS) IMPLANT
CATH FOLEY 3WAY 30CC 22FR (CATHETERS) ×1 IMPLANT
CATH URET 5FR 28IN OPEN ENDED (CATHETERS) ×2 IMPLANT
CLOTH BEACON ORANGE TIMEOUT ST (SAFETY) ×3 IMPLANT
ELECT REM PT RETURN 9FT ADLT (ELECTROSURGICAL) ×3
ELECTRODE REM PT RTRN 9FT ADLT (ELECTROSURGICAL) IMPLANT
EVACUATOR MICROVAS BLADDER (UROLOGICAL SUPPLIES) IMPLANT
GLOVE BIO SURGEON STRL SZ7.5 (GLOVE) ×3 IMPLANT
GOWN STRL REUS W/ TWL XL LVL3 (GOWN DISPOSABLE) ×2 IMPLANT
GOWN STRL REUS W/TWL XL LVL3 (GOWN DISPOSABLE) ×4
HOLDER FOLEY CATH W/STRAP (MISCELLANEOUS) ×2 IMPLANT
IV NS IRRIG 3000ML ARTHROMATIC (IV SOLUTION) ×6 IMPLANT
KIT TURNOVER CYSTO (KITS) ×3 IMPLANT
MANIFOLD NEPTUNE II (INSTRUMENTS) ×3 IMPLANT
PACK CYSTO (CUSTOM PROCEDURE TRAY) ×3 IMPLANT
SYR 30ML LL (SYRINGE) IMPLANT
TUBE CONNECTING 12'X1/4 (SUCTIONS)
TUBE CONNECTING 12X1/4 (SUCTIONS) IMPLANT
WATER STERILE IRR 3000ML UROMA (IV SOLUTION) ×2 IMPLANT
WATER STERILE IRR 500ML POUR (IV SOLUTION) ×3 IMPLANT

## 2019-02-20 NOTE — Anesthesia Procedure Notes (Signed)
Procedure Name: LMA Insertion Date/Time: 02/20/2019 11:46 AM Performed by: Mechele Claude, CRNA Pre-anesthesia Checklist: Patient identified, Emergency Drugs available, Suction available and Patient being monitored Patient Re-evaluated:Patient Re-evaluated prior to induction Oxygen Delivery Method: Circle system utilized Preoxygenation: Pre-oxygenation with 100% oxygen Induction Type: IV induction Ventilation: Mask ventilation without difficulty LMA: LMA inserted LMA Size: 5.0 Number of attempts: 1 Airway Equipment and Method: Bite block Placement Confirmation: positive ETCO2 Tube secured with: Tape Dental Injury: Teeth and Oropharynx as per pre-operative assessment  Comments: Unable to get 4 LMA to seat well.  Traded out for a 5 LMA.  Seated well with 350 TV.

## 2019-02-20 NOTE — H&P (Signed)
f/u for transitional cell carcinoma  HPI: Brendan Evans is a 73 year-old male established patient who is here for surveillance of bladder cancer.  He underwent a TURBT. His last bladder tumor was resected approximately 10/30/2017. He has had the a total of 1 bladder resections. The patient had their first TURBT in approximately 10/30/2017.   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   The patient did not have any post-operative bladder instillations.   His last cysto was 03/08/2018. His cystoscopy demonstrated NED.   His last radiologic test to evaluate the kidneys was approximately 10/30/2017. Imaging results: b/l RPG: nml. The patient did not have labs prior to his office visit today.   He is not having pain in new locations. He has not had blood in his urine recently. He has not recently had unwanted weight loss.   Interval: Patient returns today for follow-up for his history of bladder cancer for routine cystoscopy. Patient denies any gross hematuria or UTIs. He states he is overdue for the cystoscopy.      ALLERGIES:  sulfa    MEDICATIONS: Omeprazole 20 mg capsule,delayed release  Apriso 0.375 gram capsule, ext release 24 hr  Dicyclomine Hcl 20 mg tablet  FLUoxetine HCl - 40 MG Oral Capsule Oral  Glucosamine  Librax 5-2.5 MG Oral Capsule Oral  Probiotic  Simethicone 125 mg capsule  Verapamil Er 240 mg tablet, extended release     GU PSH: Cystoscopy - 02/26/2018, 09/25/2017 Cystoscopy TURBT 2-5 cm - 10/31/2017 Vasectomy - 2011       [place] Notes: Surgery Of Male Genitalia Vasectomy, Knee Surgery (1999)   NON-GU PSH: Tonsillectomy, 1961     GU PMH: Bladder Cancer Lateral - 02/26/2018, - 11/13/2017 Gross hematuria - 09/25/2017 BPH w/LUTS, Benign localized prostatic hyperplasia with lower urinary tract symptoms (LUTS) - 2014 Hematuria, Unspec, Hematuria - 2014 Renal cyst, Renal cyst, acquired - 2014      PMH Notes:  1898-04-17  00:00:00 - Note: Normal Routine History And Physical Adult   NON-GU PMH: None   FAMILY HISTORY: 1 Daughter - Daughter 1 son - Son Family Health Status Number - Runs In Family Father Deceased At [employer] ___ - Runs In Family Mother Deceased At Age 7 from diabetic complicati - Runs In Family   SOCIAL HISTORY: Marital Status: Widowed Preferred Language: English; Ethnicity: Not Hispanic Or Latino; Race: White Current Smoking Status: Patient does not smoke anymore. Has not smoked since 09/15/1977. Smoked for 12 years. Smoked 1 pack per day.   Tobacco Use Assessment Completed: Used Tobacco in last 30 days? Does drink.  Does not drink caffeine. Patient's occupation is/was Retired.     Notes: Marital History - Single, Caffeine Use, Tobacco Use, Alcohol Use   REVIEW OF SYSTEMS:    GU Review Male:   Patient reports get up at night to urinate. Patient denies frequent urination, hard to postpone urination, burning/ pain with urination, leakage of urine, stream starts and stops, trouble starting your stream, have to strain to urinate , erection problems, and penile pain.  Gastrointestinal (Upper):   Patient denies nausea, vomiting, and indigestion/ heartburn.  Gastrointestinal (Lower):   Patient denies diarrhea and constipation.  Constitutional:   Patient denies fever, night sweats, weight loss, and fatigue.  Skin:   Patient denies skin rash/ lesion and itching.  Eyes:   Patient denies blurred vision and double vision.  Ears/ Nose/ Throat:   Patient denies sore throat and sinus problems.  Hematologic/Lymphatic:  Patient denies swollen glands and easy bruising.  Cardiovascular:   Patient denies leg swelling and chest pains.  Respiratory:   Patient denies cough and shortness of breath.  Endocrine:   Patient denies excessive thirst.  Musculoskeletal:   Patient denies back pain and joint pain.  Neurological:   Patient denies dizziness and headaches.  Psychologic:   Patient denies depression and  anxiety.   VITAL SIGNS:      02/06/2019 03:04 PM  Weight 198 lb / 89.81 kg  Height 70 in / 177.8 cm  BP 146/84 mmHg  Pulse 67 /min  Temperature 97.7 F / 36.5 C  BMI 28.4 kg/m   PAST DATA REVIEWED:  Source Of History:  Patient  Urine Test Review:   Urinalysis   PROCEDURES:         Flexible Cystoscopy - 52000  Risks, benefits, and some of the potential complications of the procedure were discussed at length with the patient including infection, bleeding, voiding discomfort, urinary retention, fever, chills, sepsis, and others. All questions were answered. Informed consent was obtained. Sterile technique and intraurethral analgesia were used.  Meatus:  Normal size. Normal location. Normal condition.  Urethra:  No strictures.  External Sphincter:  Normal.  Verumontanum:  Normal.  Prostate:  Non-obstructing. No hyperplasia.  Bladder Neck:  Non-obstructing.  Ureteral Orifices:  Normal location. Normal size. Normal shape. Effluxed clear urine.  Bladder:  A small (<3 cm) papillary inferior posterior wall tumor. Mod trabeculations. Normal mucosa. No stones.      The lower urinary tract was carefully examined. The procedure was well-tolerated and without complications. Antibiotic instructions were given. Instructions were given to call the office immediately for bloody urine, difficulty urinating, urinary retention, painful or frequent urination, fever, chills, nausea, vomiting or other illness. The patient stated that he understood these instructions and would comply with them.         Urinalysis Dipstick Dipstick Cont'd  Color: Yellow Bilirubin: Neg mg/dL  Appearance: Clear Ketones: Neg mg/dL  Specific Gravity: 1.025 Blood: Neg ery/uL  pH: 5.5 Protein: Neg mg/dL  Glucose: Neg mg/dL Urobilinogen: 0.2 mg/dL    Nitrites: Neg    Leukocyte Esterase: Neg leu/uL    ASSESSMENT:      ICD-10 Details  1 GU:   Bladder Cancer Posterior - C67.4 Patient has bladder cancer recurrence with a  small papillary tumor on the inferior posterior wall. I discussed the cystoscopy findings with the patient and that he will need change usual resection. His surgery will be scheduled with Dr. Louis Meckel     PLAN:           Document Letter(s):  Created for Patient: Clinical Summary         Notes:   TURBT to be scheduled

## 2019-02-20 NOTE — Discharge Instructions (Signed)
Transurethral Resection of Bladder Tumor (TURBT) or Bladder Biopsy ° ° °Definition: ° Transurethral Resection of the Bladder Tumor is a surgical procedure used to diagnose and remove tumors within the bladder. TURBT is the most common treatment for early stage bladder cancer. ° °General instructions: °   ° Your recent bladder surgery requires very little post hospital care but some definite precautions. ° °Despite the fact that no skin incisions were used, the area around the bladder incisions are raw and covered with scabs to promote healing and prevent bleeding. Certain precautions are needed to insure that the scabs are not disturbed over the next 2-4 weeks while the healing proceeds. ° °Because the raw surface inside your bladder and the irritating effects of urine you may expect frequency of urination and/or urgency (a stronger desire to urinate) and perhaps even getting up at night more often. This will usually resolve or improve slowly over the healing period. You may see some blood in your urine over the first 6 weeks. Do not be alarmed, even if the urine was clear for a while. Get off your feet and drink lots of fluids until clearing occurs. If you start to pass clots or don't improve call us. ° °Diet: ° °You may return to your normal diet immediately. Because of the raw surface of your bladder, alcohol, spicy foods, foods high in acid and drinks with caffeine may cause irritation or frequency and should be used in moderation. To keep your urine flowing freely and avoid constipation, drink plenty of fluids during the day (8-10 glasses). Tip: Avoid cranberry juice because it is very acidic. ° °Activity: ° °Your physical activity doesn't need to be restricted. However, if you are very active, you may see some blood in the urine. We suggest that you reduce your activity under the circumstances until the bleeding has stopped. ° °Bowels: ° °It is important to keep your bowels regular during the postoperative  period. Straining with bowel movements can cause bleeding. A bowel movement every other day is reasonable. Use a mild laxative if needed, such as milk of magnesia 2-3 tablespoons, or 2 Dulcolax tablets. Call if you continue to have problems. If you had been taking narcotics for pain, before, during or after your surgery, you may be constipated. Take a laxative if necessary. ° ° ° °Medication: ° °You should resume your pre-surgery medications unless told not to. In addition you may be given an antibiotic to prevent or treat infection. Antibiotics are not always necessary. All medication should be taken as prescribed until the bottles are finished unless you are having an unusual reaction to one of the drugs. ° ° ° ° ° °Post Anesthesia Home Care Instructions ° °Activity: °Get plenty of rest for the remainder of the day. A responsible individual must stay with you for 24 hours following the procedure.  °For the next 24 hours, DO NOT: °-Drive a car °-Operate machinery °-Drink alcoholic beverages °-Take any medication unless instructed by your physician °-Make any legal decisions or sign important papers. ° °Meals: °Start with liquid foods such as gelatin or soup. Progress to regular foods as tolerated. Avoid greasy, spicy, heavy foods. If nausea and/or vomiting occur, drink only clear liquids until the nausea and/or vomiting subsides. Call your physician if vomiting continues. ° °Special Instructions/Symptoms: °Your throat may feel dry or sore from the anesthesia or the breathing tube placed in your throat during surgery. If this causes discomfort, gargle with warm salt water. The discomfort should disappear within 24   hours. ° °If you had a scopolamine patch placed behind your ear for the management of post- operative nausea and/or vomiting: ° °1. The medication in the patch is effective for 72 hours, after which it should be removed.  Wrap patch in a tissue and discard in the trash. Wash hands thoroughly with soap and  water. °2. You may remove the patch earlier than 72 hours if you experience unpleasant side effects which may include dry mouth, dizziness or visual disturbances. °3. Avoid touching the patch. Wash your hands with soap and water after contact with the patch. °   ° ° ° °

## 2019-02-20 NOTE — Transfer of Care (Signed)
  Last Vitals:  Vitals Value Taken Time  BP 159/89 02/20/19 1230  Temp    Pulse 79 02/20/19 1231  Resp 15 02/20/19 1231  SpO2 99 % 02/20/19 1231  Vitals shown include unvalidated device data.  Last Pain:  Vitals:   02/20/19 0936  TempSrc: Oral  PainSc: 0-No pain      Patients Stated Pain Goal: 6 (02/20/19 0936)  Immediate Anesthesia Transfer of Care Note  Patient: Brendan Evans  Procedure(s) Performed: Procedure(s) (LRB): TRANSURETHRAL RESECTION OF BLADDER TUMOR WITH GRMCITABINE, BILATERAL RETROGRADE PYELOGRAMS (N/A)  Patient Location: PACU  Anesthesia Type: General  Level of Consciousness: awake, alert  and oriented  Airway & Oxygen Therapy: Patient Spontanous Breathing and Patient connected to nasal cannula oxygen  Post-op Assessment: Report given to PACU RN and Post -op Vital signs reviewed and stable  Post vital signs: Reviewed and stable  Complications: No apparent anesthesia complications

## 2019-02-20 NOTE — Interval H&P Note (Signed)
History and Physical Interval Note:  02/20/2019 11:11 AM  Brendan Evans  has presented today for surgery, with the diagnosis of BLADDER TUMOR.  The various methods of treatment have been discussed with the patient and family. After consideration of risks, benefits and other options for treatment, the patient has consented to  Procedure(s): TRANSURETHRAL RESECTION OF BLADDER TUMOR WITH GRMCITABINE (N/A) as a surgical intervention.  The patient's history has been reviewed, patient examined, no change in status, stable for surgery.  I have reviewed the patient's chart and labs.  Questions were answered to the patient's satisfaction.     Ardis Hughs

## 2019-02-20 NOTE — Anesthesia Postprocedure Evaluation (Signed)
Anesthesia Post Note  Patient: Brendan Evans  Procedure(s) Performed: TRANSURETHRAL RESECTION OF BLADDER TUMOR WITH GRMCITABINE, BILATERAL RETROGRADE PYELOGRAMS (N/A Bladder)     Patient location during evaluation: PACU Anesthesia Type: General Level of consciousness: awake and alert Pain management: pain level controlled Vital Signs Assessment: post-procedure vital signs reviewed and stable Respiratory status: spontaneous breathing, nonlabored ventilation, respiratory function stable and patient connected to nasal cannula oxygen Cardiovascular status: blood pressure returned to baseline and stable Postop Assessment: no apparent nausea or vomiting Anesthetic complications: no    Last Vitals:  Vitals:   02/20/19 1300 02/20/19 1315  BP: (!) 168/98 (!) 178/97  Pulse: 84 85  Resp: 18 13  Temp:    SpO2: 93% 95%    Last Pain:  Vitals:   02/20/19 1300  TempSrc:   PainSc: 9                  [name] S

## 2019-02-20 NOTE — Interval H&P Note (Signed)
History and Physical Interval Note:  02/20/2019 11:21 AM  Brendan Evans  has presented today for surgery, with the diagnosis of BLADDER TUMOR.  The various methods of treatment have been discussed with the patient and family. After consideration of risks, benefits and other options for treatment, the patient has consented to  Procedure(s): TRANSURETHRAL RESECTION OF BLADDER TUMOR WITH GRMCITABINE (N/A) as a surgical intervention.  The patient's history has been reviewed, patient examined, no change in status, stable for surgery.  I have reviewed the patient's chart and labs.  Questions were answered to the patient's satisfaction.     Ardis Hughs

## 2019-02-20 NOTE — Anesthesia Preprocedure Evaluation (Signed)
Anesthesia Evaluation  Patient identified by MRN, date of birth, ID band Patient awake    Reviewed: Allergy & Precautions, NPO status , Patient's Chart, lab work & pertinent test results  Airway Mallampati: II  TM Distance: >3 FB Neck ROM: Full    Dental no notable dental hx.    Pulmonary neg pulmonary ROS, former smoker,    Pulmonary exam normal breath sounds clear to auscultation       Cardiovascular hypertension, Pt. on medications Normal cardiovascular exam Rhythm:Regular Rate:Normal     Neuro/Psych negative neurological ROS  negative psych ROS   GI/Hepatic Neg liver ROS, GERD  Medicated,  Endo/Other  negative endocrine ROS  Renal/GU negative Renal ROS  negative genitourinary   Musculoskeletal negative musculoskeletal ROS (+)   Abdominal   Peds negative pediatric ROS (+)  Hematology negative hematology ROS (+)   Anesthesia Other Findings   Reproductive/Obstetrics negative OB ROS                             Anesthesia Physical Anesthesia Plan  ASA: II  Anesthesia Plan: General   Post-op Pain Management:    Induction: Intravenous  PONV Risk Score and Plan: 2 and Ondansetron, Dexamethasone and Treatment may vary due to age or medical condition  Airway Management Planned: LMA  Additional Equipment:   Intra-op Plan:   Post-operative Plan: Extubation in OR  Informed Consent: I have reviewed the patients History and Physical, chart, labs and discussed the procedure including the risks, benefits and alternatives for the proposed anesthesia with the patient or authorized representative who has indicated his/her understanding and acceptance.     Dental advisory given  Plan Discussed with: CRNA and Surgeon  Anesthesia Plan Comments:         Anesthesia Quick Evaluation  

## 2019-02-20 NOTE — Op Note (Signed)
Preoperative diagnosis:  1. Recurrent bladder cancer  Postoperative diagnosis:  1. Same  Procedure: 1. TURBT, 1.5 cm, right posterior inferior wall 2. Bilateral retrograde pyelogram with interpretation 3. Instillation of postoperative intravesical gemcitabine  Surgeon: Ardis Hughs, MD  Anesthesia: General  Complications: None  Intraoperative findings:  #1: The right retrograde pyelogram was performed using 7 cc of Omnipaque contrast through a 5 [place] open-ended ureteral catheter and demonstrated a normal caliber ureter with no filling defects, sharp calyces and no hydroureteronephrosis. #2: The left retrograde pyelogram was performed using 7 cc of Omnipaque contrast through a 5 [place] open-ended ureteral catheter and demonstrated a normal caliber ureter and the filling defects or significant abnormalities.  The renal pelvis was normal with no hydronephrosis in the calyces were sharp. #3: The patient had a solitary low-grade appearing lesion in the posterior inferior wall of the bladder measuring approximate 1.5 cm.  EBL: Minimal  Specimens: None  Indication: Brendan Evans is a 73 y.o. patient with history of low-grade bladder cancer who was seen in clinic approximately 3 weeks ago and noted to have recurrence of his cancer with a solitary lesion in the posterior wall of his bladder..  After reviewing the management options for treatment, he elected to proceed with the above surgical procedure(s). We have discussed the potential benefits and risks of the procedure, side effects of the proposed treatment, the likelihood of the patient achieving the goals of the procedure, and any potential problems that might occur during the procedure or recuperation. Informed consent has been obtained.  Description of procedure:  The patient was taken to the operating room and general anesthesia was induced.  The patient was placed in the dorsal lithotomy position, prepped and draped in the  usual sterile fashion, and preoperative antibiotics were administered. A preoperative time-out was performed.   21 French 30 degree cystoscope was gently passed to the patient's urethra and into the bladder under visual guidance.  Cystoscopy was performed with the above findings.  I exchanged the 30 degree lens for the 70 degree lens and repeated the evaluation with no additional findings.  I then replaced a 30 degree lens and performed retrograde pyelograms as described above.  I then used the rigid biopsy forceps and was able to remove the tumor in 3 bites.  Reinspection of this area demonstrated complete resection of the tumor.  I then fulgurated the base of the tumor as well as the edges with Bugbee cautery.  Exam under anesthesia demonstrated a normal prostate with no nodules and a mobile bladder.  Lidocaine jelly was placed into the patient's urethra and 18 French Foley catheter inserted.  A B&O suppository placed in the patient's rectum.  He was subsequently extubated return the PACU in stable condition.  In the PACU, ~70mL's with 2gm of Gemcitabine was instilled into the patient's bladder through an 59 French Foley catheter.  This was allowed to dwell for 60-90 minutes prior to emptying the Foley catheter and removing it.   Ardis Hughs, M.D.

## 2019-02-21 ENCOUNTER — Encounter (HOSPITAL_BASED_OUTPATIENT_CLINIC_OR_DEPARTMENT_OTHER): Payer: Self-pay | Admitting: Urology

## 2019-02-21 LAB — SURGICAL PATHOLOGY

## 2019-09-12 MED FILL — PHENAZOPYRIDINE 200 MG TAB: 200 | 3 days supply | Qty: 10 | Fill #0

## 2022-06-01 ENCOUNTER — Other Ambulatory Visit (HOSPITAL_COMMUNITY): Payer: Self-pay

## 2022-06-01 MED ORDER — DOXYCYCLINE HYCLATE 100 MG PO TABS
100.0000 mg | ORAL_TABLET | Freq: Two times a day (BID) | ORAL | 0 refills | Status: AC
  Filled 2022-06-01: qty 28, 14d supply, fill #0

## 2022-06-22 ENCOUNTER — Other Ambulatory Visit (HOSPITAL_COMMUNITY): Payer: Self-pay

## 2022-06-22 MED ORDER — MELOXICAM 15 MG PO TABS
15.0000 mg | ORAL_TABLET | Freq: Every day | ORAL | 0 refills | Status: AC
  Filled 2022-06-22: qty 30, 30d supply, fill #0

## 2022-06-22 MED ORDER — TAMSULOSIN HCL 0.4 MG PO CAPS
0.4000 mg | ORAL_CAPSULE | Freq: Every day | ORAL | 0 refills | Status: AC
  Filled 2022-06-22: qty 30, 30d supply, fill #0

## 2023-06-21 ENCOUNTER — Other Ambulatory Visit (HOSPITAL_COMMUNITY): Payer: Self-pay

## 2023-06-21 MED ORDER — TAMSULOSIN HCL 0.4 MG PO CAPS
0.4000 mg | ORAL_CAPSULE | Freq: Every day | ORAL | 3 refills | Status: AC
  Filled 2023-06-21: qty 90, 90d supply, fill #0

## 2023-09-17 ENCOUNTER — Other Ambulatory Visit (HOSPITAL_COMMUNITY): Payer: Self-pay
